# Patient Record
Sex: Male | Born: 1970 | Race: Black or African American | Hispanic: No | Marital: Married
Health system: Southern US, Community
[De-identification: ages and names within clinical notes are randomized; demographics above are authoritative.]

---

## 2018-03-21 ENCOUNTER — Other Ambulatory Visit: Payer: Self-pay | Admitting: Internal Medicine

## 2018-03-21 ENCOUNTER — Other Ambulatory Visit: Payer: Self-pay

## 2018-03-21 ENCOUNTER — Ambulatory Visit
Admission: RE | Admit: 2018-03-21 | Discharge: 2018-03-21 | Disposition: A | Discharge status: Home / Self Care | Payer: BLUE CROSS/BLUE SHIELD | Source: Ambulatory Visit | Attending: Internal Medicine | Admitting: Internal Medicine

## 2018-03-21 DIAGNOSIS — M5489 Other dorsalgia: Secondary | ICD-10-CM

## 2018-08-08 ENCOUNTER — Other Ambulatory Visit: Payer: Self-pay | Admitting: Internal Medicine

## 2018-08-08 DIAGNOSIS — M545 Low back pain, unspecified: Secondary | ICD-10-CM

## 2018-08-19 ENCOUNTER — Other Ambulatory Visit: Payer: BLUE CROSS/BLUE SHIELD

## 2018-09-16 ENCOUNTER — Ambulatory Visit
Admission: RE | Admit: 2018-09-16 | Discharge: 2018-09-16 | Disposition: A | Discharge status: Home / Self Care | Payer: BC Managed Care – PPO | Source: Ambulatory Visit | Attending: Internal Medicine | Admitting: Internal Medicine

## 2018-09-16 ENCOUNTER — Other Ambulatory Visit: Payer: Self-pay

## 2018-09-16 DIAGNOSIS — M545 Low back pain, unspecified: Secondary | ICD-10-CM

## 2020-06-01 IMAGING — MR MR LUMBAR SPINE W/O CM
4 of 5 series · 28 of 48 positions shown · non-contrast
Comparison: None.

CLINICAL DATA: Low back pain radiating into both lower extremities
with numbness and weakness.

EXAM:
MRI LUMBAR SPINE WITHOUT CONTRAST
TECHNIQUE: Multiplanar, multisequence MR imaging of the lumbar spine was
performed. No intravenous contrast was administered.

[Series 3: T2 · sagittal · 4.0mm · 1.09mm/px · 7 of 17 slices shown (1 of 2)]
[im 1/17]
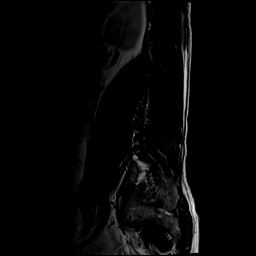
[im 3/17]
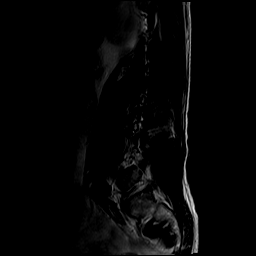
[im 6/17]
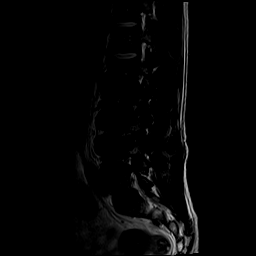
[im 9/17]
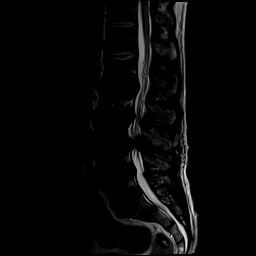
[im 11/17]
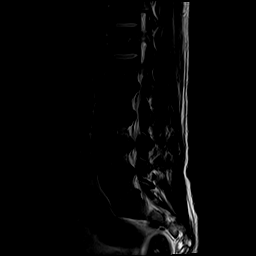
[im 14/17]
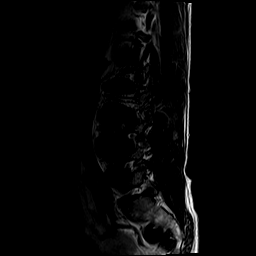
[im 17/17]
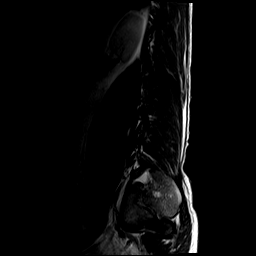

[Series 5: T1 · sagittal · 4.0mm · 1.09mm/px · 6 of 17 slices shown (1 of 2)]
[im 1/17]
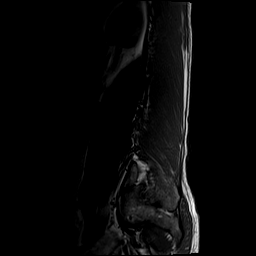
[im 4/17]
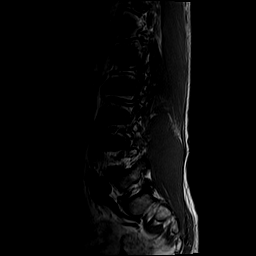
[im 7/17]
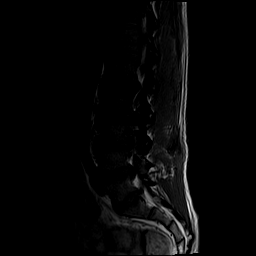
[im 10/17]
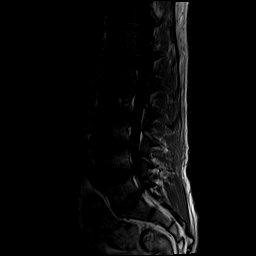
[im 13/17]
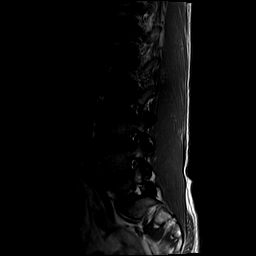
[im 17/17]
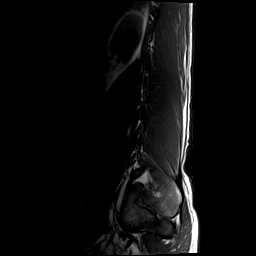

[Series 6: T2 · axial · 4.0mm · 0.39mm/px · z∈[-96,+105]mm · 8 of 38 slices shown (2 of 2)]
[im 1/38]
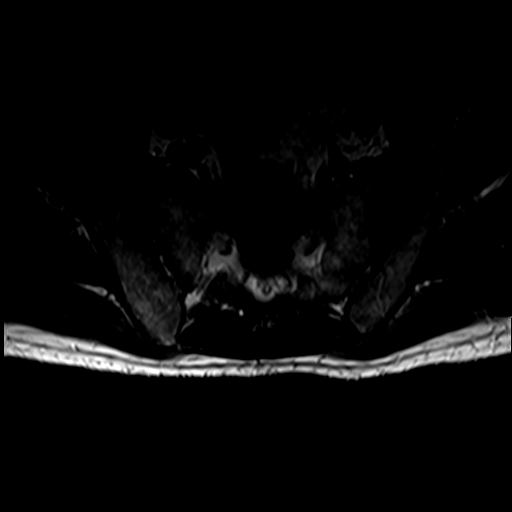
[im 6/38]
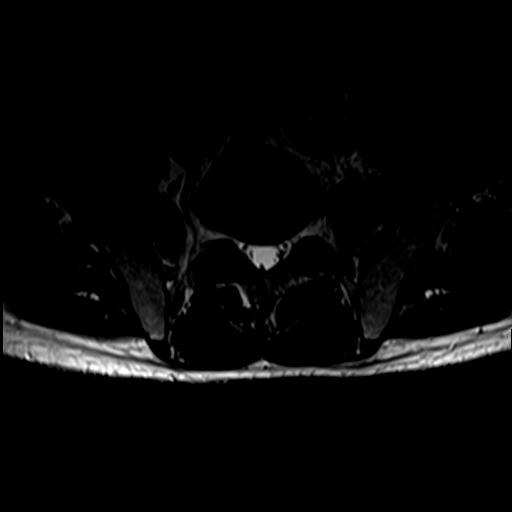
[im 12/38]
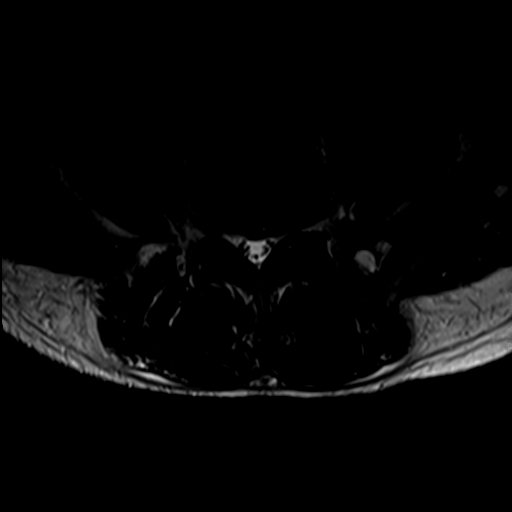
[im 18/38]
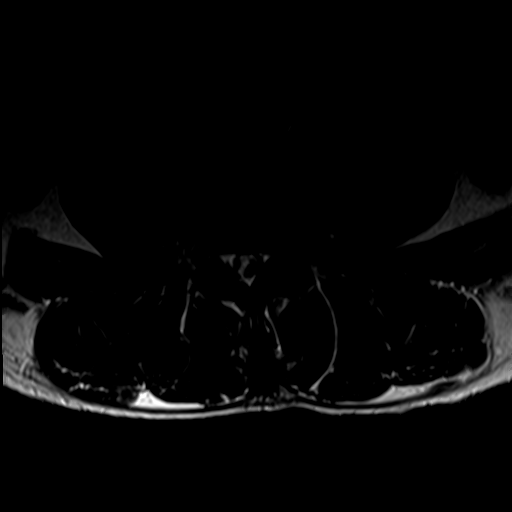
[im 20/38]
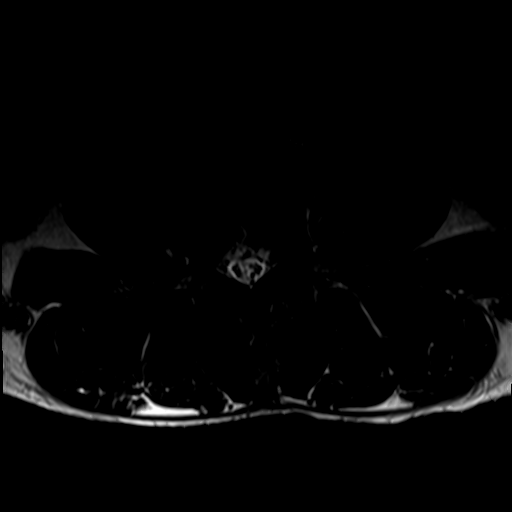
[im 26/38]
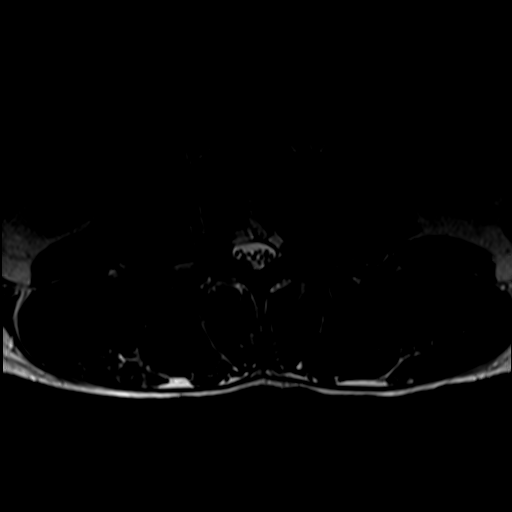
[im 32/38]
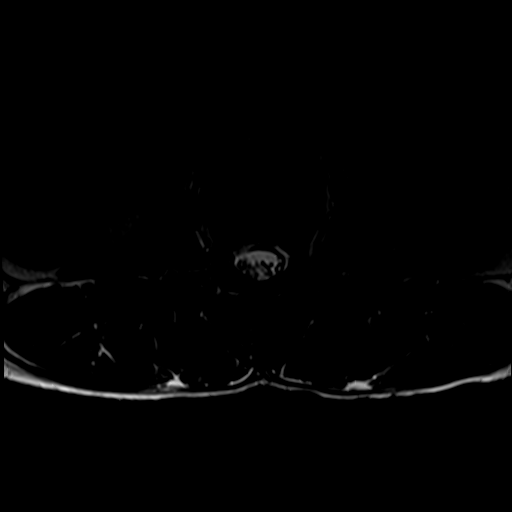
[im 38/38]
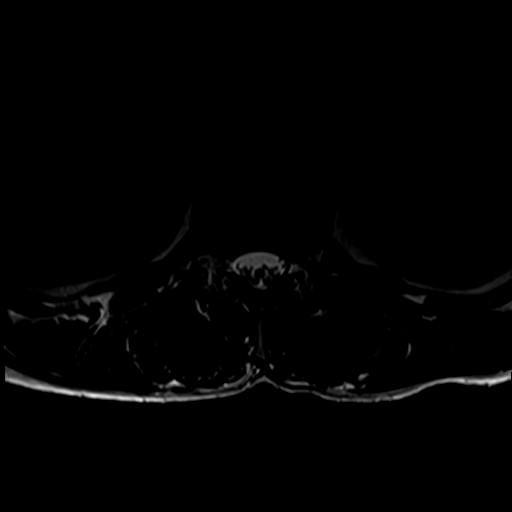

[Series 7: T1 · axial · 4.0mm · 0.39mm/px · z∈[-96,+76]mm · 7 of 38 slices shown (2 of 2)]
[im 1/38]
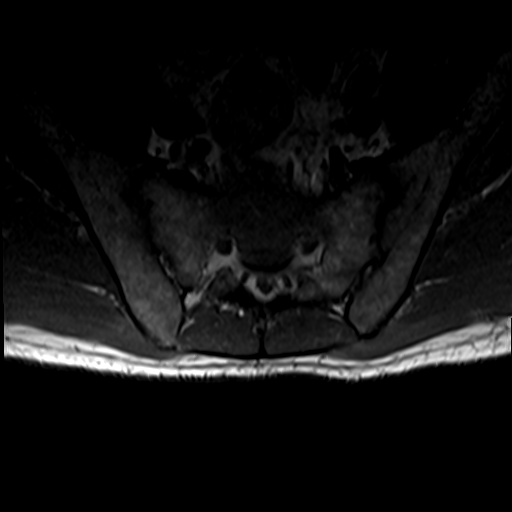
[im 6/38]
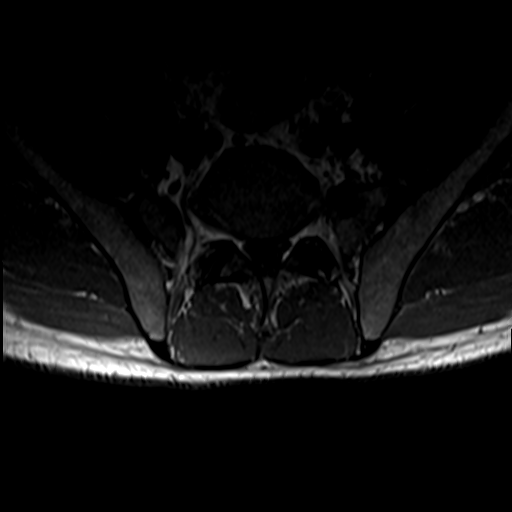
[im 12/38]
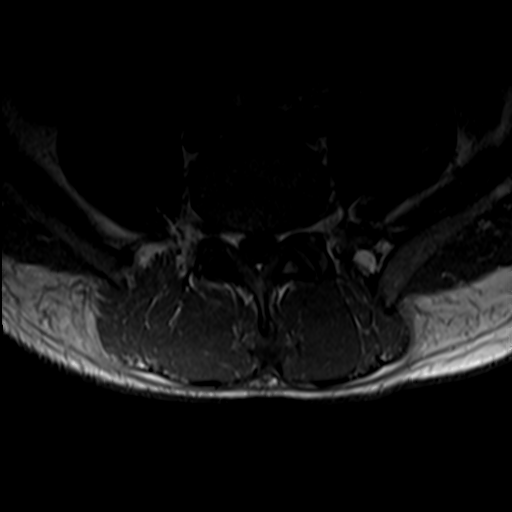
[im 18/38]
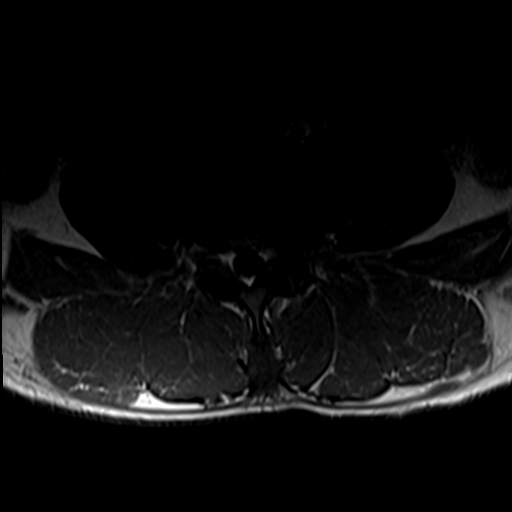
[im 20/38]
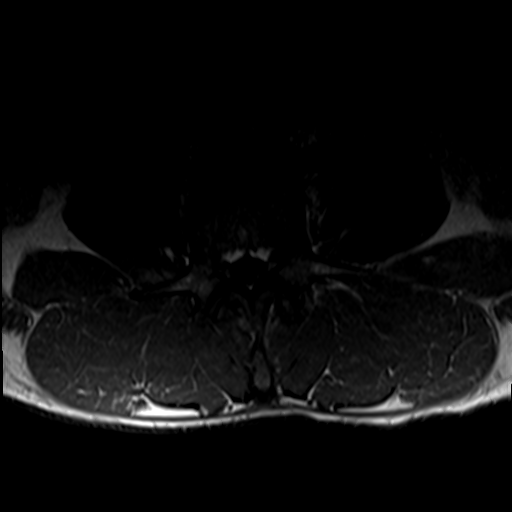
[im 26/38]
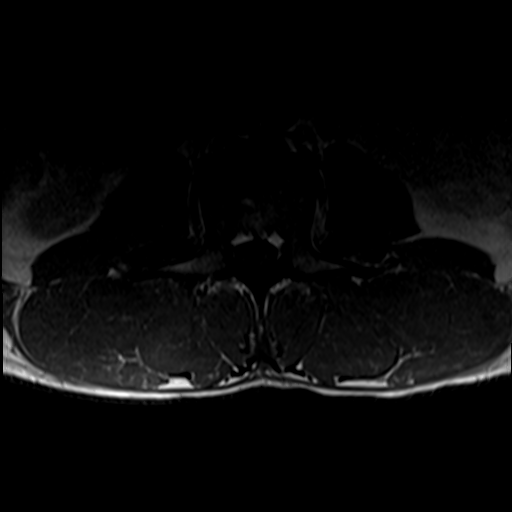
[im 32/38]
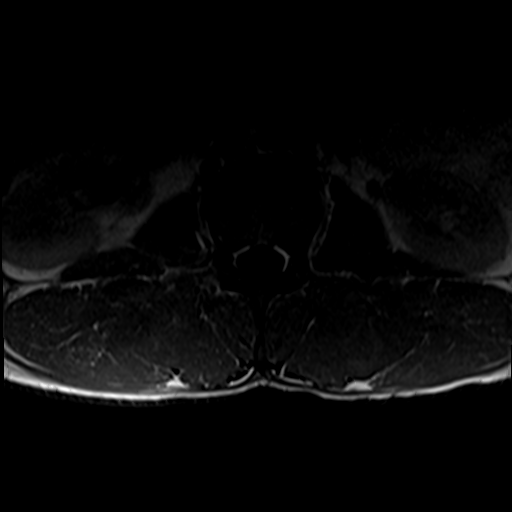

[28 of 48 positions shown; findings below may reference images not displayed]

FINDINGS: Segmentation:  Standard.

Alignment:  Physiologic.

Vertebrae:  No fracture, evidence of discitis, or bone lesion.

Conus medullaris and cauda equina: Conus extends to the T12 level.
Conus and cauda equina appear normal.

Paraspinal and other soft tissues: Negative

Disc levels:

Sagittal imaging of T11-12 is normal.

T12-L1: Normal disc space and facets. No spinal canal or
neuroforaminal stenosis.

L1-L2: Disc desiccation with mild diffuse bulge. Mild spinal canal
stenosis and mild bilateral neural foraminal stenosis.

L2-L3: Disc space narrowing with small disc bulge and endplate
spurring. Moderate spinal canal stenosis with narrowing of both
lateral recesses. Mild right and moderate left neural foraminal
stenosis.

L3-L4: Intermediate disc bulge with endplate spurring, left
eccentric. Moderate spinal canal stenosis. Severe narrowing of the
left lateral recess and left neural foramen. Mild right foraminal
stenosis.

L4-L5: Small central disc protrusion superimposed on mild disc
bulge. Mild spinal canal stenosis. No neural impingement.

L5-S1: Disc space narrowing with endplate spurring.  No stenosis.

Visualized sacrum: Normal.
IMPRESSION: 1. L3-4 moderate spinal canal stenosis with severe left neural
foraminal and lateral recess narrowing.
2. L2-3 moderate spinal canal stenosis with mild right and moderate
left neural foraminal stenosis.
3. L4-5 and L1-2 mild spinal canal stenosis.

## 2021-04-15 ENCOUNTER — Ambulatory Visit (INDEPENDENT_AMBULATORY_CARE_PROVIDER_SITE_OTHER): Payer: BC Managed Care – PPO

## 2021-04-15 ENCOUNTER — Encounter: Payer: Self-pay | Admitting: Family

## 2021-04-15 ENCOUNTER — Ambulatory Visit: Payer: BC Managed Care – PPO | Admitting: Family

## 2021-04-15 DIAGNOSIS — M7651 Patellar tendinitis, right knee: Secondary | ICD-10-CM | POA: Diagnosis not present

## 2021-04-15 DIAGNOSIS — M25561 Pain in right knee: Secondary | ICD-10-CM

## 2021-04-15 MED ORDER — DICLOFENAC SODIUM 1 % EX GEL: 2.0000 g | Freq: Four times a day (QID) | CUTANEOUS | 1 refills | Status: AC | PRN

## 2021-04-15 NOTE — Progress Notes (Deleted)
? ?  Office Visit Note ?  ?Patient: Erik Hunt           ?Date of Birth: 15-Sep-1970           ?MRN: [id number] ?Visit Date:  ?             ?Requested by: Gwenyth Bender, MD ?[address] ?[address] ?[place],  [place] [address] ?PCP: Gwenyth Bender, MD ? ? ?Assessment & Plan: ?Visit Diagnoses:  ?1. Right knee pain, unspecified chronicity   ?2. Patellar tendinitis of right knee   ? ? ?Plan: *** ? ?Follow-Up Instructions: Return in about 4 weeks (around 05/13/2021), or if symptoms worsen or fail to improve.  ? ?Orders:  ?Orders Placed This Encounter  ?Procedures  ?? XR Knee 1-2 Views Right  ? ?Meds ordered this encounter  ?Medications  ?? diclofenac Sodium (VOLTAREN) 1 % GEL  ?  Sig: Apply 2 g topically 4 (four) times daily as needed.  ?  Dispense:  150 g  ?  Refill:  1  ? ? ? ? Procedures: ?No procedures performed ? ? ?Clinical Data: ?No additional findings. ? ? ?Subjective: ?Chief Complaint  ?Patient presents with  ?? Right Knee - Pain  ? ? ?HPI ? ?Review of Systems ? ? ?Objective: ?Vital Signs: There were no vitals taken for this visit. ? ?Physical Exam ? ?Ortho Exam ? ?Specialty Comments:  ?No specialty comments available. ? ?Imaging: ?XR Knee 1-2 Views Right ? ?Result Date: 04/15/2021 ?Radiographs of the right knee are negative for fracture.  There is no acute finding.  He does have some mild joint space narrowing.  ? ? ? ?PMFS History: ?There are no problems to display for this patient. ? ?History reviewed. No pertinent past medical history.  ?History reviewed. No pertinent family history.  ?History reviewed. No pertinent surgical history. ?Social History  ? ?Occupational History  ?? Not on file  ?Tobacco Use  ?? Smoking status: Not on file  ?? Smokeless tobacco: Not on file  ?Substance and Sexual Activity  ?? Alcohol use: Not on file  ?? Drug use: Not on file  ?? Sexual activity: Not on file  ? ? ? ? ? ? ?

## 2021-04-15 NOTE — Progress Notes (Signed)
? ?Office Visit Note ?  ?Patient: Erik Hunt           ?Date of Birth: 10-11-1970           ?MRN: [id number] ?Visit Date: 04/15/2021 ?             ?Requested by: Gwenyth Bender, MD ?[address] ?[address] ?[place],  [place] [address] ?PCP: Gwenyth Bender, MD ? ?Chief Complaint  ?Patient presents with  ? Right Knee - Pain  ? ? ? ? ?HPI: ?Patient is a 51 year old gentleman who presents today complaining of a 1 month history of right knee pain he denies any associated injury and is pointing to the proximal patella and distal quad for sepsis on the right.  He states that he does do squats as well as other lower extremity exercise.  He is a [employer] for work.  He states that often when he makes stops he will do squats to loosen his back and his muscles. ? ?Assessment & Plan: ?Visit Diagnoses:  ?1. Right knee pain, unspecified chronicity   ? ? ?Plan: Patellar tendinitis.  Discussed using anti-inflammatories orally and topically he will take Aleve twice daily for the next 2 weeks and use Voltaren gel discussed backing off on his exercise reducing squats and things that will tax the quad tendon ? ?Follow-Up Instructions: No follow-ups on file.  ? ?Right Knee Exam  ? ?Tenderness  ?The patient is experiencing tenderness in the patellar tendon. ? ?Range of Motion  ?The patient has normal right knee ROM. ? ?Tests  ?Varus: negative Valgus: negative ? ?Other  ?Erythema: absent ?Sensation: normal ?Effusion: no effusion present ? ? ? ? ?Patient is alert, oriented, no adenopathy, well-dressed, normal affect, normal respiratory effort. ? ? ?Imaging: ?XR Knee 1-2 Views Right ? ?Result Date: 04/15/2021 ?Radiographs of the right knee are negative for fracture.  There is no acute finding.  He does have some mild joint space narrowing.  ?No images are attached to the encounter. ? ?Labs: ?No results found for: HGBA1C, ESRSEDRATE, CRP, LABURIC, REPTSTATUS, GRAMSTAIN, CULT, LABORGA ? ? ?No results found for: ALBUMIN, PREALBUMIN,  CBC ? ?No results found for: MG ?No results found for: VD25OH ? ?No results found for: PREALBUMIN ?   ? View : No data to display.  ?  ?  ?  ? ? ? ?There is no height or weight on file to calculate BMI. ? ?Orders:  ?Orders Placed This Encounter  ?Procedures  ? XR Knee 1-2 Views Right  ? ?Meds ordered this encounter  ?Medications  ? diclofenac Sodium (VOLTAREN) 1 % GEL  ?  Sig: Apply 2 g topically 4 (four) times daily as needed.  ?  Dispense:  150 g  ?  Refill:  1  ? ? ? Procedures: ?No procedures performed ? ?Clinical Data: ?No additional findings. ? ?ROS: ? ?All other systems negative, except as noted in the HPI. ?Review of Systems  ?Constitutional:  Negative for chills and fever.  ?Musculoskeletal:  Positive for arthralgias. Negative for gait problem and joint swelling.  ? ?Objective: ?Vital Signs: There were no vitals taken for this visit. ? ?Specialty Comments:  ?No specialty comments available. ? ?PMFS History: ?There are no problems to display for this patient. ? ?History reviewed. No pertinent past medical history.  ?History reviewed. No pertinent family history.  ?History reviewed. No pertinent surgical history. ?Social History  ? ?Occupational History  ? Not on file  ?Tobacco Use  ? Smoking status: Not  on file  ? Smokeless tobacco: Not on file  ?Substance and Sexual Activity  ? Alcohol use: Not on file  ? Drug use: Not on file  ? Sexual activity: Not on file  ? ? ? ? ? ?

## 2021-07-08 ENCOUNTER — Encounter: Payer: Self-pay | Admitting: Family

## 2021-07-08 ENCOUNTER — Ambulatory Visit: Payer: BC Managed Care – PPO | Admitting: Family

## 2021-07-08 DIAGNOSIS — M25561 Pain in right knee: Secondary | ICD-10-CM | POA: Diagnosis not present

## 2021-07-10 DIAGNOSIS — M25561 Pain in right knee: Secondary | ICD-10-CM

## 2021-07-10 MED ORDER — LIDOCAINE HCL 1 % IJ SOLN
5.0000 mL | INTRAMUSCULAR | Status: AC | PRN
  Administered 2021-07-10: 5 mL

## 2021-07-10 MED ORDER — METHYLPREDNISOLONE ACETATE 40 MG/ML IJ SUSP
40.0000 mg | INTRAMUSCULAR | Status: AC | PRN
  Administered 2021-07-10: 40 mg via INTRA_ARTICULAR

## 2021-07-10 NOTE — Progress Notes (Signed)
Office Visit Note   Patient: Erik Hunt           Date of Birth: [birth year]           MRN: [id number] Visit Date: 07/08/2021              Requested by: Gwenyth Bender, MD [address] [place],  [place] [address] PCP: Gwenyth Bender, MD  Chief Complaint  Patient presents with   Right Knee - Pain, Follow-up      HPI: The patient is a 51 year old gentleman who presents today for concern of worsening of his right knee pain.  He was last seen in the office in April for some patellar tendinitis on the right he was doing quite a bit of squats and lunges and exercise he has stopped doing that and feels that the tendinitis and patellar tendon pain is greatly resolved unfortunately he did have acute onset of a new knee pain over the medial joint line he states this went on for several days but has improved since he continues to significant pain with pivoting and squatting over the medial joint line  Has been using anti-inflammatories orally as well as Voltaren gel without relief  Assessment & Plan: Visit Diagnoses: No diagnosis found.  Plan: Concern for possible medial meniscus injury patient has elected to proceed with Depo-Medrol injection today we will follow with conservative treatment he will follow-up in the office in 4 weeks  Follow-Up Instructions: Return in about 4 weeks (around 08/05/2021), or if symptoms worsen or fail to improve.   Right Knee Exam   Tenderness  The patient is experiencing tenderness in the medial joint line.  Range of Motion  The patient has normal right knee ROM.  Tests  Varus: negative Valgus: negative  Other  Erythema: absent Effusion: no effusion present      Patient is alert, oriented, no adenopathy, well-dressed, normal affect, normal respiratory effort.   Imaging: No results found. No images are attached to the encounter.  Labs: No results found for: "HGBA1C", "ESRSEDRATE", "CRP", "LABURIC", "REPTSTATUS", "GRAMSTAIN",  "CULT", "LABORGA"   No results found for: "ALBUMIN", "PREALBUMIN", "CBC"  No results found for: "MG" No results found for: "VD25OH"  No results found for: "PREALBUMIN"     No data to display           There is no height or weight on file to calculate BMI.  Orders:  No orders of the defined types were placed in this encounter.  No orders of the defined types were placed in this encounter.    Procedures: Large Joint Inj: R knee on 07/10/2021 8:53 AM Indications: pain Details: 18 G 1.5 in needle, anteromedial approach Medications: 5 mL lidocaine 1 %; 40 mg methylPREDNISolone acetate 40 MG/ML Consent was given by the patient.      Clinical Data: No additional findings.  ROS:  All other systems negative, except as noted in the HPI. Review of Systems  Objective: Vital Signs: There were no vitals taken for this visit.  Specialty Comments:  No specialty comments available.  PMFS History: There are no problems to display for this patient.  History reviewed. No pertinent past medical history.  History reviewed. No pertinent family history.  History reviewed. No pertinent surgical history. Social History   Occupational History   Not on file  Tobacco Use   Smoking status: Not on file   Smokeless tobacco: Not on file  Substance and Sexual Activity   Alcohol use:  Not on file   Drug use: Not on file   Sexual activity: Not on file
